# Patient Record
Sex: Female | Born: 2000 | Race: White | Hispanic: No | Marital: Single | Smoking: Never smoker
Health system: Southern US, Community
[De-identification: ages and names within clinical notes are randomized; demographics above are authoritative.]

## PROBLEM LIST (undated history)

## (undated) DIAGNOSIS — G90A Postural orthostatic tachycardia syndrome (POTS): Secondary | ICD-10-CM

## (undated) DIAGNOSIS — R000 Tachycardia, unspecified: Secondary | ICD-10-CM

## (undated) DIAGNOSIS — I498 Other specified cardiac arrhythmias: Secondary | ICD-10-CM

## (undated) DIAGNOSIS — I951 Orthostatic hypotension: Secondary | ICD-10-CM

## (undated) HISTORY — DX: Orthostatic hypotension: I95.1

## (undated) HISTORY — PX: WISDOM TOOTH EXTRACTION: SHX21

## (undated) HISTORY — DX: Postural orthostatic tachycardia syndrome (POTS): G90.A

## (undated) HISTORY — DX: Tachycardia, unspecified: R00.0

## (undated) HISTORY — DX: Other specified cardiac arrhythmias: I49.8

---
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
## 2021-01-08 ENCOUNTER — Other Ambulatory Visit (INDEPENDENT_AMBULATORY_CARE_PROVIDER_SITE_OTHER): Payer: Self-pay | Admitting: Pediatric Endocrinology

## 2021-09-03 ENCOUNTER — Ambulatory Visit: Payer: BLUE CROSS/BLUE SHIELD | Attending: Internal Medicine

## 2021-09-03 DIAGNOSIS — Z23 Encounter for immunization: Secondary | ICD-10-CM

## 2021-09-05 ENCOUNTER — Other Ambulatory Visit (HOSPITAL_BASED_OUTPATIENT_CLINIC_OR_DEPARTMENT_OTHER): Payer: Self-pay

## 2021-09-05 MED ORDER — NOVAVAX COVID-19 VACCINE 5 MCG/0.5ML IM SUSP
INTRAMUSCULAR | 0 refills | Status: DC
  Filled 2021-09-05: qty 0.5, 1d supply, fill #0

## 2021-10-04 ENCOUNTER — Ambulatory Visit: Payer: Self-pay | Attending: Internal Medicine

## 2021-10-04 DIAGNOSIS — Z23 Encounter for immunization: Secondary | ICD-10-CM

## 2021-10-04 NOTE — Progress Notes (Signed)
   Covid-19 Vaccination Clinic  Name:  Charlotte Mack    MRN: [id number] DOB: [birth year]  10/04/2021  [name] was observed post Covid-19 immunization for 15 minutes without incident. She was provided with Vaccine Information Sheet and instruction to access the V-Safe system.   [name] was instructed to call 911 with any severe reactions post vaccine: Difficulty breathing  Swelling of face and throat  A fast heartbeat  A bad rash all over body  Dizziness and weakness   Immunizations Administered     Name Date Dose VIS Date Route   Novavax(covid-19) Vaccine 10/04/2021  1:09 PM 0.5 mL 11/14/2020 Intramuscular   Manufacturer: [employer]   Lot: [id number]   NDC: 78938-101-75

## 2021-10-28 ENCOUNTER — Other Ambulatory Visit (HOSPITAL_BASED_OUTPATIENT_CLINIC_OR_DEPARTMENT_OTHER): Payer: Self-pay

## 2021-10-28 MED ORDER — NOVAVAX COVID-19 VACCINE 5 MCG/0.5ML IM SUSP
INTRAMUSCULAR | 0 refills | Status: AC
  Filled 2021-10-28: qty 0.5, 1d supply, fill #0

## 2022-10-10 ENCOUNTER — Encounter (INDEPENDENT_AMBULATORY_CARE_PROVIDER_SITE_OTHER): Payer: Self-pay
